# Patient Record
Sex: Male | Born: 1975 | Race: White | Hispanic: No | Marital: Married | Smoking: Never smoker
Health system: Southern US, Community
[De-identification: ages and names within clinical notes are randomized; demographics above are authoritative.]

## PROBLEM LIST (undated history)

## (undated) DIAGNOSIS — F909 Attention-deficit hyperactivity disorder, unspecified type: Secondary | ICD-10-CM

## (undated) HISTORY — DX: Attention-deficit hyperactivity disorder, unspecified type: F90.9

---

## 2000-03-07 ENCOUNTER — Emergency Department (HOSPITAL_COMMUNITY): Admission: EM | Admit: 2000-03-07 | Discharge: 2000-03-07 | Payer: Self-pay | Admitting: Emergency Medicine

## 2001-11-25 HISTORY — PX: MICRODISCECTOMY LUMBAR: SUR864

## 2001-11-26 ENCOUNTER — Encounter: Payer: Self-pay | Admitting: Family Medicine

## 2001-11-26 ENCOUNTER — Encounter: Admission: RE | Admit: 2001-11-26 | Discharge: 2001-11-26 | Payer: Self-pay | Admitting: Family Medicine

## 2001-12-15 ENCOUNTER — Observation Stay (HOSPITAL_COMMUNITY): Admission: RE | Admit: 2001-12-15 | Discharge: 2001-12-16 | Payer: Self-pay | Admitting: Neurological Surgery

## 2001-12-22 ENCOUNTER — Encounter: Payer: Self-pay | Admitting: Neurological Surgery

## 2001-12-22 ENCOUNTER — Ambulatory Visit (HOSPITAL_COMMUNITY): Admission: RE | Admit: 2001-12-22 | Discharge: 2001-12-22 | Payer: Self-pay | Admitting: Neurological Surgery

## 2001-12-23 ENCOUNTER — Encounter: Payer: Self-pay | Admitting: Neurological Surgery

## 2001-12-23 ENCOUNTER — Observation Stay (HOSPITAL_COMMUNITY): Admission: RE | Admit: 2001-12-23 | Discharge: 2001-12-24 | Payer: Self-pay | Admitting: Neurological Surgery

## 2002-03-01 ENCOUNTER — Encounter: Payer: Self-pay | Admitting: Neurological Surgery

## 2002-03-01 ENCOUNTER — Ambulatory Visit (HOSPITAL_COMMUNITY): Admission: RE | Admit: 2002-03-01 | Discharge: 2002-03-01 | Payer: Self-pay | Admitting: Neurological Surgery

## 2002-03-26 ENCOUNTER — Encounter: Payer: Self-pay | Admitting: Neurological Surgery

## 2002-03-26 ENCOUNTER — Encounter: Admission: RE | Admit: 2002-03-26 | Discharge: 2002-03-26 | Payer: Self-pay | Admitting: Neurological Surgery

## 2003-05-31 ENCOUNTER — Encounter: Payer: Self-pay | Admitting: Family Medicine

## 2003-05-31 ENCOUNTER — Encounter: Admission: RE | Admit: 2003-05-31 | Discharge: 2003-05-31 | Payer: Self-pay | Admitting: Family Medicine

## 2011-02-27 ENCOUNTER — Other Ambulatory Visit: Payer: Self-pay | Admitting: Neurological Surgery

## 2011-02-27 DIAGNOSIS — M545 Low back pain: Secondary | ICD-10-CM

## 2011-03-05 ENCOUNTER — Ambulatory Visit
Admission: RE | Admit: 2011-03-05 | Discharge: 2011-03-05 | Disposition: A | Discharge status: Home / Self Care | Payer: 59 | Source: Ambulatory Visit | Attending: Neurological Surgery | Admitting: Neurological Surgery

## 2011-03-05 DIAGNOSIS — M545 Low back pain: Secondary | ICD-10-CM

## 2016-04-25 HISTORY — PX: BICEPS TENDON REPAIR: SHX566

## 2016-05-23 DIAGNOSIS — S46212A Strain of muscle, fascia and tendon of other parts of biceps, left arm, initial encounter: Secondary | ICD-10-CM | POA: Diagnosis not present

## 2016-05-24 DIAGNOSIS — X58XXXA Exposure to other specified factors, initial encounter: Secondary | ICD-10-CM | POA: Diagnosis not present

## 2016-05-24 DIAGNOSIS — S46212A Strain of muscle, fascia and tendon of other parts of biceps, left arm, initial encounter: Secondary | ICD-10-CM | POA: Diagnosis not present

## 2016-05-24 DIAGNOSIS — G8918 Other acute postprocedural pain: Secondary | ICD-10-CM | POA: Diagnosis not present

## 2016-06-01 DIAGNOSIS — S46212D Strain of muscle, fascia and tendon of other parts of biceps, left arm, subsequent encounter: Secondary | ICD-10-CM | POA: Diagnosis not present

## 2016-06-01 DIAGNOSIS — M25522 Pain in left elbow: Secondary | ICD-10-CM | POA: Diagnosis not present

## 2016-06-20 DIAGNOSIS — Z9889 Other specified postprocedural states: Secondary | ICD-10-CM | POA: Diagnosis not present

## 2016-07-03 DIAGNOSIS — Z9889 Other specified postprocedural states: Secondary | ICD-10-CM | POA: Diagnosis not present

## 2016-11-29 DIAGNOSIS — R05 Cough: Secondary | ICD-10-CM | POA: Diagnosis not present

## 2016-11-29 DIAGNOSIS — Z6827 Body mass index (BMI) 27.0-27.9, adult: Secondary | ICD-10-CM | POA: Diagnosis not present

## 2016-11-29 DIAGNOSIS — Z77118 Contact with and (suspected) exposure to other environmental pollution: Secondary | ICD-10-CM | POA: Diagnosis not present

## 2017-01-21 DIAGNOSIS — K219 Gastro-esophageal reflux disease without esophagitis: Secondary | ICD-10-CM | POA: Diagnosis not present

## 2017-01-21 DIAGNOSIS — R5383 Other fatigue: Secondary | ICD-10-CM | POA: Diagnosis not present

## 2017-01-21 DIAGNOSIS — R0789 Other chest pain: Secondary | ICD-10-CM | POA: Diagnosis not present

## 2017-01-21 DIAGNOSIS — I10 Essential (primary) hypertension: Secondary | ICD-10-CM | POA: Diagnosis not present

## 2017-01-22 DIAGNOSIS — Z125 Encounter for screening for malignant neoplasm of prostate: Secondary | ICD-10-CM | POA: Diagnosis not present

## 2017-01-22 DIAGNOSIS — E784 Other hyperlipidemia: Secondary | ICD-10-CM | POA: Diagnosis not present

## 2017-01-22 DIAGNOSIS — I10 Essential (primary) hypertension: Secondary | ICD-10-CM | POA: Diagnosis not present

## 2017-01-22 DIAGNOSIS — Z0000 Encounter for general adult medical examination without abnormal findings: Secondary | ICD-10-CM | POA: Diagnosis not present

## 2017-02-06 NOTE — Progress Notes (Signed)
Cardiology Office Note   Date:  02/09/2017   ID:  Frank Walton, DOB [birth year], MRN [id number]  PCP:  Minda MeoARONSON,RICHARD A, MD  Cardiologist:   Rollene RotundaJames Keriann Rankin, MD  Referring:  Geoffry ParadiseAronson, Richard, MD  Chief Complaint  Patient presents with  . Chest Pain     History of Present Illness: Frank Walton is a 41 y.o. male who presents for evaluation of chest pain.  He reports his happen once severely a few weeks ago but it's actually been going on as a constant discomfort. He has a lot of emotional stress. 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 He is physically active on his job in caring back packs and the like. With this he denies any cardiovascular symptoms. He can't bring on the chest discomfort with this. It's a moderate aching at best.  There were not associated symptoms such as nausea vomiting or diaphoresis. He's not had any palpitations, presyncope or syncope. Denies any PND or orthopnea. He's had no weight gain or edema.     Past Medical History:  Diagnosis Date  . ADHD     Past Surgical History:  Procedure Laterality Date  . BICEPS TENDON REPAIR  04/2016  . MICRODISCECTOMY LUMBAR  2003     Current Outpatient Prescriptions  Medication Sig Dispense Refill  . amphetamine-dextroamphetamine (ADDERALL) 10 MG tablet Take 10 mg by mouth daily with breakfast.    . Multiple Vitamins-Minerals (EMERGEN-C IMMUNE PO) Take 1 tablet by mouth daily.    Marland Kitchen. PROAIR RESPICLICK 108 (90 Base) MCG/ACT AEPB Inhale 2 puffs into the lungs daily as needed.  6  . rosuvastatin (CRESTOR) 10 MG tablet Take 10 mg by mouth daily.  6   No current facility-administered medications for this visit.     Allergies:   Patient has no known allergies.    Social History:  The patient  reports that he has never smoked. He has quit using smokeless tobacco. His smokeless tobacco use included Chew.   Family History:  The patient's history  does not include any first degree relatives with heart disease at an early age.  His parents are alive and without significant comorbid disease.     ROS:  Please see the history of present illness.   Otherwise, review of systems are positive for none.   All other systems are reviewed and negative.    PHYSICAL EXAM: VS:  BP (!) 128/98 (BP Location: Right Arm, Cuff Size: Large)   Pulse 75   Ht 5\' 10"  (1.778 m)   Wt 197 lb 9.6 oz (89.6 kg)   BMI 28.35 kg/m  , BMI Body mass index is 28.35 kg/m. GENERAL:  Well appearing HEENT:  Pupils equal round and reactive, fundi not visualized, oral mucosa unremarkable NECK:  No jugular venous distention, waveform within normal limits, carotid upstroke brisk and symmetric, no bruits, no thyromegaly LYMPHATICS:  No cervical, inguinal adenopathy LUNGS:  Clear to auscultation bilaterally BACK:  No CVA tenderness CHEST:  Unremarkable HEART:  PMI not displaced or sustained,S1 and S2 within normal limits, no S3, no S4, no clicks, no rubs, no murmurs ABD:  Flat, positive bowel sounds normal in frequency in pitch, no bruits, no rebound, no guarding, no midline pulsatile mass, no hepatomegaly, no splenomegaly EXT:  2 plus pulses throughout, no edema, no cyanosis no clubbing SKIN:  No rashes no nodules NEURO:  Cranial nerves II through XII grossly  intact, motor grossly intact throughout PSYCH:  Cognitively intact, oriented to person place and time    EKG:  EKG is ordered today. The ekg ordered today demonstrates NSR, rate 75, axis WNL, no acute ST T wave changes.     Recent Labs: No results found for requested labs within last 8760 hours.    Lipid Panel No results found for: CHOL, TRIG, HDL, CHOLHDL, VLDL, LDLCALC, LDLDIRECT    Wt Readings from Last 3 Encounters:  02/07/17 197 lb 9.6 oz (89.6 kg)      Other studies Reviewed: Additional studies/ records that were reviewed today include: None. Review of the above records demonstrates:  Please see  elsewhere in the note.     ASSESSMENT AND PLAN:  CHEST PAIN:   I think that the pretest probability of obstructive CAD is low.  I will bring the patient back for a POET (Plain Old Exercise Test). This will allow me to screen for obstructive coronary disease, risk stratify and very importantly provide a prescription for exercise.  DYSLIPIDEMIA:  I would like to get the results of his recent lipid profile to review.  He and I talked about the correlation with heart disease, stress, lipids and other lifestyle issues.     Current medicines are reviewed at length with the patient today.  The patient does not have concerns regarding medicines.  The following changes have been made:  no change  Labs/ tests ordered today include:   Orders Placed This Encounter  Procedures  . EXERCISE TOLERANCE TEST  . EKG 12-Lead     Disposition:   FU with me as needed.     Signed, Rollene Rotunda, MD  02/09/2017 3:11 PM    [facility]

## 2017-02-07 ENCOUNTER — Encounter: Payer: Self-pay | Admitting: Cardiology

## 2017-02-07 ENCOUNTER — Ambulatory Visit (INDEPENDENT_AMBULATORY_CARE_PROVIDER_SITE_OTHER): Payer: BLUE CROSS/BLUE SHIELD | Admitting: Cardiology

## 2017-02-07 VITALS — BP 128/98 | HR 75 | Ht 70.0 in | Wt 197.6 lb

## 2017-02-07 DIAGNOSIS — R079 Chest pain, unspecified: Secondary | ICD-10-CM | POA: Diagnosis not present

## 2017-02-07 DIAGNOSIS — E785 Hyperlipidemia, unspecified: Secondary | ICD-10-CM

## 2017-02-07 NOTE — Patient Instructions (Signed)

## 2017-02-09 ENCOUNTER — Encounter: Payer: Self-pay | Admitting: Cardiology

## 2017-02-09 DIAGNOSIS — R079 Chest pain, unspecified: Secondary | ICD-10-CM | POA: Insufficient documentation

## 2017-02-09 DIAGNOSIS — E785 Hyperlipidemia, unspecified: Secondary | ICD-10-CM | POA: Insufficient documentation

## 2017-02-19 ENCOUNTER — Telehealth (HOSPITAL_COMMUNITY): Payer: Self-pay

## 2017-02-19 NOTE — Telephone Encounter (Signed)
Encounter complete. 

## 2017-02-21 ENCOUNTER — Ambulatory Visit (HOSPITAL_COMMUNITY)
Admission: RE | Admit: 2017-02-21 | Discharge: 2017-02-21 | Disposition: A | Discharge status: Home / Self Care | Payer: BLUE CROSS/BLUE SHIELD | Source: Ambulatory Visit | Attending: Internal Medicine | Admitting: Internal Medicine

## 2017-02-21 DIAGNOSIS — R079 Chest pain, unspecified: Secondary | ICD-10-CM

## 2017-02-21 DIAGNOSIS — E030 Congenital hypothyroidism with diffuse goiter: Secondary | ICD-10-CM | POA: Diagnosis not present

## 2017-02-21 LAB — EXERCISE TOLERANCE TEST
CHL RATE OF PERCEIVED EXERTION: 16
CSEPED: 12 min
CSEPEDS: 0 s
CSEPEW: 13.7 METS
MPHR: 178 {beats}/min
Peak HR: 173 {beats}/min
Percent HR: 97 %
Rest HR: 82 {beats}/min

## 2017-03-10 DIAGNOSIS — R0789 Other chest pain: Secondary | ICD-10-CM | POA: Diagnosis not present

## 2017-03-10 DIAGNOSIS — I10 Essential (primary) hypertension: Secondary | ICD-10-CM | POA: Diagnosis not present

## 2017-03-10 DIAGNOSIS — E784 Other hyperlipidemia: Secondary | ICD-10-CM | POA: Diagnosis not present

## 2017-03-10 DIAGNOSIS — F909 Attention-deficit hyperactivity disorder, unspecified type: Secondary | ICD-10-CM | POA: Diagnosis not present

## 2017-03-19 DIAGNOSIS — F909 Attention-deficit hyperactivity disorder, unspecified type: Secondary | ICD-10-CM | POA: Diagnosis not present

## 2017-07-07 DIAGNOSIS — F908 Attention-deficit hyperactivity disorder, other type: Secondary | ICD-10-CM | POA: Diagnosis not present

## 2017-12-21 DIAGNOSIS — M25511 Pain in right shoulder: Secondary | ICD-10-CM | POA: Diagnosis not present

## 2018-01-20 DIAGNOSIS — Z0000 Encounter for general adult medical examination without abnormal findings: Secondary | ICD-10-CM | POA: Diagnosis not present

## 2018-01-20 DIAGNOSIS — Z125 Encounter for screening for malignant neoplasm of prostate: Secondary | ICD-10-CM | POA: Diagnosis not present

## 2018-01-20 DIAGNOSIS — I10 Essential (primary) hypertension: Secondary | ICD-10-CM | POA: Diagnosis not present

## 2018-01-20 DIAGNOSIS — R82998 Other abnormal findings in urine: Secondary | ICD-10-CM | POA: Diagnosis not present

## 2018-01-26 DIAGNOSIS — E7849 Other hyperlipidemia: Secondary | ICD-10-CM | POA: Diagnosis not present

## 2018-01-26 DIAGNOSIS — E663 Overweight: Secondary | ICD-10-CM | POA: Diagnosis not present

## 2018-01-26 DIAGNOSIS — Z0000 Encounter for general adult medical examination without abnormal findings: Secondary | ICD-10-CM | POA: Diagnosis not present

## 2018-01-26 DIAGNOSIS — Z1389 Encounter for screening for other disorder: Secondary | ICD-10-CM | POA: Diagnosis not present

## 2018-01-26 DIAGNOSIS — Z77118 Contact with and (suspected) exposure to other environmental pollution: Secondary | ICD-10-CM | POA: Diagnosis not present

## 2018-01-26 DIAGNOSIS — R05 Cough: Secondary | ICD-10-CM | POA: Diagnosis not present

## 2018-01-26 DIAGNOSIS — I10 Essential (primary) hypertension: Secondary | ICD-10-CM | POA: Diagnosis not present

## 2018-01-26 DIAGNOSIS — Z6827 Body mass index (BMI) 27.0-27.9, adult: Secondary | ICD-10-CM | POA: Diagnosis not present

## 2018-07-29 DIAGNOSIS — E7849 Other hyperlipidemia: Secondary | ICD-10-CM | POA: Diagnosis not present

## 2018-07-29 DIAGNOSIS — I10 Essential (primary) hypertension: Secondary | ICD-10-CM | POA: Diagnosis not present

## 2018-07-29 DIAGNOSIS — K219 Gastro-esophageal reflux disease without esophagitis: Secondary | ICD-10-CM | POA: Diagnosis not present

## 2018-07-29 DIAGNOSIS — M1990 Unspecified osteoarthritis, unspecified site: Secondary | ICD-10-CM | POA: Diagnosis not present

## 2019-02-02 DIAGNOSIS — Z0000 Encounter for general adult medical examination without abnormal findings: Secondary | ICD-10-CM | POA: Diagnosis not present

## 2019-02-02 DIAGNOSIS — I10 Essential (primary) hypertension: Secondary | ICD-10-CM | POA: Diagnosis not present

## 2019-02-02 DIAGNOSIS — E7849 Other hyperlipidemia: Secondary | ICD-10-CM | POA: Diagnosis not present

## 2019-02-02 DIAGNOSIS — Z125 Encounter for screening for malignant neoplasm of prostate: Secondary | ICD-10-CM | POA: Diagnosis not present

## 2019-02-08 DIAGNOSIS — M1990 Unspecified osteoarthritis, unspecified site: Secondary | ICD-10-CM | POA: Diagnosis not present

## 2019-02-08 DIAGNOSIS — Z0000 Encounter for general adult medical examination without abnormal findings: Secondary | ICD-10-CM | POA: Diagnosis not present

## 2019-02-08 DIAGNOSIS — I10 Essential (primary) hypertension: Secondary | ICD-10-CM | POA: Diagnosis not present

## 2019-02-08 DIAGNOSIS — E7849 Other hyperlipidemia: Secondary | ICD-10-CM | POA: Diagnosis not present

## 2019-02-08 DIAGNOSIS — Z1331 Encounter for screening for depression: Secondary | ICD-10-CM | POA: Diagnosis not present

## 2019-02-08 DIAGNOSIS — K219 Gastro-esophageal reflux disease without esophagitis: Secondary | ICD-10-CM | POA: Diagnosis not present

## 2019-12-04 DIAGNOSIS — Z20828 Contact with and (suspected) exposure to other viral communicable diseases: Secondary | ICD-10-CM | POA: Diagnosis not present

## 2019-12-23 DIAGNOSIS — M25551 Pain in right hip: Secondary | ICD-10-CM | POA: Diagnosis not present

## 2019-12-23 DIAGNOSIS — M25552 Pain in left hip: Secondary | ICD-10-CM | POA: Diagnosis not present

## 2020-02-07 DIAGNOSIS — E7849 Other hyperlipidemia: Secondary | ICD-10-CM | POA: Diagnosis not present

## 2020-02-07 DIAGNOSIS — Z125 Encounter for screening for malignant neoplasm of prostate: Secondary | ICD-10-CM | POA: Diagnosis not present

## 2020-02-07 DIAGNOSIS — Z0000 Encounter for general adult medical examination without abnormal findings: Secondary | ICD-10-CM | POA: Diagnosis not present

## 2020-02-14 DIAGNOSIS — R6882 Decreased libido: Secondary | ICD-10-CM | POA: Diagnosis not present

## 2020-02-14 DIAGNOSIS — E785 Hyperlipidemia, unspecified: Secondary | ICD-10-CM | POA: Diagnosis not present

## 2020-02-14 DIAGNOSIS — Z0000 Encounter for general adult medical examination without abnormal findings: Secondary | ICD-10-CM | POA: Diagnosis not present

## 2020-02-14 DIAGNOSIS — I10 Essential (primary) hypertension: Secondary | ICD-10-CM | POA: Diagnosis not present

## 2020-02-14 DIAGNOSIS — F909 Attention-deficit hyperactivity disorder, unspecified type: Secondary | ICD-10-CM | POA: Diagnosis not present

## 2020-06-02 DIAGNOSIS — S46211A Strain of muscle, fascia and tendon of other parts of biceps, right arm, initial encounter: Secondary | ICD-10-CM | POA: Diagnosis not present

## 2020-06-05 DIAGNOSIS — S46211A Strain of muscle, fascia and tendon of other parts of biceps, right arm, initial encounter: Secondary | ICD-10-CM | POA: Diagnosis not present

## 2020-06-07 ENCOUNTER — Other Ambulatory Visit: Payer: Self-pay | Admitting: Orthopedic Surgery

## 2020-06-07 DIAGNOSIS — M25521 Pain in right elbow: Secondary | ICD-10-CM

## 2020-08-16 DIAGNOSIS — I10 Essential (primary) hypertension: Secondary | ICD-10-CM | POA: Diagnosis not present

## 2020-11-06 DIAGNOSIS — R059 Cough, unspecified: Secondary | ICD-10-CM | POA: Diagnosis not present

## 2020-11-06 DIAGNOSIS — R509 Fever, unspecified: Secondary | ICD-10-CM | POA: Diagnosis not present

## 2020-11-06 DIAGNOSIS — G4730 Sleep apnea, unspecified: Secondary | ICD-10-CM | POA: Diagnosis not present

## 2020-12-06 ENCOUNTER — Encounter: Payer: Self-pay | Admitting: Pulmonary Disease

## 2020-12-06 ENCOUNTER — Other Ambulatory Visit: Payer: Self-pay

## 2020-12-06 ENCOUNTER — Ambulatory Visit: Payer: BC Managed Care – PPO | Admitting: Pulmonary Disease

## 2020-12-06 DIAGNOSIS — R053 Chronic cough: Secondary | ICD-10-CM | POA: Insufficient documentation

## 2020-12-06 MED ORDER — FLOVENT HFA 110 MCG/ACT IN AERO: 1.0000 | INHALATION_SPRAY | Freq: Every day | RESPIRATORY_TRACT | 12 refills | Status: DC

## 2020-12-06 MED ORDER — OMEPRAZOLE 40 MG PO CPDR: 40.0000 mg | DELAYED_RELEASE_CAPSULE | Freq: Every day | ORAL | 1 refills | Status: AC

## 2020-12-06 NOTE — Assessment & Plan Note (Addendum)
His seasonal cough may be related to environmental exposure to diesel fumes.  Current cough might be a post bronchitis cough, unclear if this is being perpetuated by postnasal drip and reflux .  We will try to target all 3  For postnasal drip, take Chlor-Trimeton 4 mg at bedtime for 2 weeks Daytime, take store brand Sudafed/ walphed /phenylephrine 10 mg daily for 2 weeks  Prescription for omeprazole 40 mg daily for 6 weeks for reflux. Prescription for Flovent 110 -1 puff daily, rinse mouth after use  If cough persist beyond 12 weeks then proceed with ENT evaluation versus HRCT

## 2020-12-06 NOTE — Patient Instructions (Signed)
  Your cough might be a post bronchitis cough, unclear if this is being perpetuated by postnasal drip and reflux  For postnasal drip, take Chlor-Trimeton 4 mg at bedtime for 2 weeks Daytime, take store brand Sudafed/ walphed /phenylephrine 10 mg daily for 2 weeks  Prescription for omeprazole 40 mg daily for 6 weeks for reflux. Prescription for Flovent 110 -1 puff daily, rinse mouth after use  Call me in 2 to 4 weeks to report

## 2020-12-06 NOTE — Progress Notes (Signed)
Subjective:    Patient ID: Frank Walton, male    DOB: [birth year], 45 y.o.   MRN: [id number]  HPI  Chief Complaint  Patient presents with  . Consult    Cough that is dry and at times makes him vomit.  Going on about 9 weeks now.  Feels that this is a little better.    45 year old never smoker presents for evaluation of chronic cough for the past 9 weeks. His wife Frank Walton is a perfusionist at [employer]. He reports a seasonal cough every winter that starts around January and improves by spring, ongoing for the last 6 years.  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  His current cough has been lasting about 9 weeks, he reports URI symptoms at onset followed by cough.  He tested negative for COVID several times and the flu.  He states that this cough is "different" from his usual cough both in tone and intensity and accompanied by hoarseness of his voice. He was treated by his PCP with prednisone and promethazine/dextromethorphan cough syrup.  He took a course of Zyrtec and also tried [employer] without any improvement.  He tested negative for COVID multiple times. Cough is of severe intensity, worse after lunch and in the evenings, does not wake him up from sleep, worsened by talking eating or drinking.  He denies childhood history of asthma but reports that even as a child he was sensitive to fumes and would develop a cough when he would drive into [place] due to the smog He reports occasional GERD symptoms that are worsened by beer and chocolate.  Reports some nasal congestion, denies seasonal allergies.  He takes Pepcid over-the-counter intermittently for reflux symptoms  Chest x-ray was reported normal at PCP office   Past Medical History:  Diagnosis Date  . ADHD    Past Surgical History:  Procedure Laterality Date  . BICEPS TENDON REPAIR  04/2016  . MICRODISCECTOMY LUMBAR   2003    No Known Allergies  Social History   Socioeconomic History  . Marital status: Married    Spouse name: Not on file  . Number of children: Not on file  . Years of education: Not on file  . Highest education level: Not on file  Occupational History  . Not on file  Tobacco Use  . Smoking status: Never Smoker  . Smokeless tobacco: Former Neurosurgeon    Types: Chew  Substance and Sexual Activity  . Alcohol use: Not on file  . Drug use: Not on file  . Sexual activity: Not on file  Other Topics Concern  . Not on file  Social History Narrative  . Not on file   Social Determinants of Health   Financial Resource Strain: Not on file  Food Insecurity: Not on file  Transportation Needs: Not on file  Physical Activity: Not on file  Stress: Not on file  Social Connections: Not on file  Intimate Partner Violence: Not on file    History reviewed. No pertinent family history.    Review of Systems Constitutional: negative for anorexia, fevers and sweats  Eyes: negative for irritation, redness and visual disturbance  Ears, nose, mouth, throat, and face: negative for earaches, epistaxis, nasal congestion and sore throat  Respiratory: negative for cough, dyspnea on exertion, sputum and wheezing  Cardiovascular: negative for chest pain, dyspnea, lower extremity edema, orthopnea,  palpitations and syncope  Gastrointestinal: negative for abdominal pain, constipation, diarrhea, melena, nausea and vomiting  Genitourinary:negative for dysuria, frequency and hematuria  Hematologic/lymphatic: negative for bleeding, easy bruising and lymphadenopathy  Musculoskeletal:negative for arthralgias, muscle weakness and stiff joints  Neurological: negative for coordination problems, gait problems, headaches and weakness  Endocrine: negative for diabetic symptoms including polydipsia, polyuria and weight loss     Objective:   Physical Exam  Gen. Pleasant, well-nourished, in no distress, normal  affect ENT - no pallor,icterus, no post nasal drip Neck: No JVD, no thyromegaly, no carotid bruits Lungs: no use of accessory muscles, no dullness to percussion, clear without rales or rhonchi  Cardiovascular: Rhythm regular, heart sounds  normal, no murmurs or gallops, no peripheral edema Abdomen: soft and non-tender, no hepatosplenomegaly, BS normal. Musculoskeletal: No deformities, no cyanosis or clubbing Neuro:  alert, non focal        Assessment & Plan:

## 2020-12-15 ENCOUNTER — Institutional Professional Consult (permissible substitution): Payer: BC Managed Care – PPO | Admitting: Pulmonary Disease

## 2021-01-05 ENCOUNTER — Encounter: Payer: Self-pay | Admitting: Primary Care

## 2021-01-05 ENCOUNTER — Other Ambulatory Visit: Payer: Self-pay

## 2021-01-05 ENCOUNTER — Ambulatory Visit: Payer: BC Managed Care – PPO | Admitting: Primary Care

## 2021-01-05 DIAGNOSIS — R053 Chronic cough: Secondary | ICD-10-CM | POA: Diagnosis not present

## 2021-01-05 NOTE — Progress Notes (Signed)
@Frank Walton  ID: , male    DOB: [birth year], 45 y.o.   MRN: [id number]  Chief Complaint  Frank Walton presents with  . Follow-up    Referring provider: [id number], MD  HPI: 45 year old male, never smoked.  Past medical history significant for dyslipidemia and chronic cough.  Frank Walton of Dr. 49, seen for initial consult on 12/06/2020. 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 Cough could also be post viral or triggered by PND/GERD.   For post nasal drip he was advised to take Sudafed 10mg  daily for 2 weeks and Chlor-trimeton 4mg  at bedtime x 2 weeks. For GERD he was advised to take omeprazole 40mg  daily x 6 weeks. For cough he was given prescription for Flovent [employer] one puff daily. If cough persists beyond 12 weeks then need ENT evaluation vs HRCT.    01/05/2021 Frank Walton presents today for 1 month follow-up chronic cough. He has a seasonal cough which starts around January and improves by spring.  This is been ongoing for the last 6 years. He has had his current cough for the last 9-12 weeks.  He was treated with course of prednisone and promethazine/dextromethorphan cough syrup.  Cough is worse after eating and drinking.  He has occasional GERD symptoms and nasal congestion.  He takes over-the-counter Pepcid as needed.  He has tested negative for Covid several times.   He is doing well today, no acute complaints. His cough is 90% better. He has been taking medication as prescribed religously. He has two weeks left of omeprazole course. Continues using flovent inhaler one puff daily.    No Known Allergies   There is no immunization history on file for this Frank Walton.  Past Medical History:  Diagnosis Date  . ADHD     Tobacco History: Social History   Tobacco Use  Smoking Status Never Smoker  Smokeless Tobacco Former  . Types: Chew   Counseling given: Not Answered   Outpatient Medications Prior to Visit   Medication Sig Dispense Refill  . amphetamine-dextroamphetamine (ADDERALL) 10 MG tablet Take 10 mg by mouth daily with breakfast.    . fluticasone (FLOVENT HFA) 110 MCG/ACT inhaler Inhale 1 puff into the lungs daily. RINSE AFTER EACH USE 1 each 12  . losartan (COZAAR) 50 MG tablet losartan 50 mg tablet  TAKE 1 TABLET BY MOUTH EVERY DAY    . Multiple Vitamins-Minerals (EMERGEN-C IMMUNE PO) Take 1 tablet by mouth daily.    03/05/2021 omeprazole (PRILOSEC) 40 MG capsule Take 1 capsule (40 mg total) by mouth daily. 30 capsule 1  . rosuvastatin (CRESTOR) 10 MG tablet Take 10 mg by mouth daily.  6  . PROAIR RESPICLICK 108 (90 Base) MCG/ACT AEPB Inhale 2 puffs into the lungs daily as needed. (Frank Walton not taking: Reported on 01/05/2021)  6  . promethazine-dextromethorphan (PROMETHAZINE-DM) 6.25-15 MG/5ML syrup  (Frank Walton not taking: Reported on 01/05/2021)     No facility-administered medications prior to visit.    Review of Systems  Review of Systems  Constitutional: Negative.   Respiratory: Negative for cough, shortness of breath and wheezing.    Physical Exam  BP 120/80 (BP Location: Right Arm, Frank Walton Position: Sitting, Cuff Size: Normal)   Pulse 86   Temp 98.1 F (36.7 C) (Temporal)   Ht 5\' 11"  (1.803 m)   Wt 194 lb 6.4 oz (88.2 kg)   SpO2 98%   BMI 27.11 kg/m  Physical Exam Constitutional:  Appearance: Normal appearance.  HENT:     Mouth/Throat:     Comments: Deferred d/t masking Cardiovascular:     Rate and Rhythm: Normal rate and regular rhythm.  Pulmonary:     Effort: Pulmonary effort is normal.     Breath sounds: Normal breath sounds.  Musculoskeletal:        General: Normal range of motion.  Skin:    General: Skin is warm and dry.  Neurological:     General: No focal deficit present.     Mental Status: He is alert and oriented to person, place, and time. Mental status is at baseline.  Psychiatric:        Mood and Affect: Mood normal.        Behavior: Behavior normal.         Thought Content: Thought content normal.        Judgment: Judgment normal.      Lab Results:  CBC No results found for: WBC, RBC, HGB, HCT, PLT, MCV, MCH, MCHC, RDW, LYMPHSABS, MONOABS, EOSABS, BASOSABS  BMET No results found for: NA, K, CL, CO2, GLUCOSE, BUN, CREATININE, CALCIUM, GFRNONAA, GFRAA  BNP No results found for: BNP  ProBNP No results found for: PROBNP  Imaging: No results found.   Assessment & Plan:   Chronic cough - Cough has significantly improved while on PPI and ICS therapy. He stopped Sudafed and Chlor-trimeton after two weeks and cough did not return. Recommend continuing Omeprazole 40mg  for additional 2 weeks and continue Flovent one puff daily during winter months. If cough returns advised he resume first medication he stopped before cough came back. FU in 6-9 months (fall 2022)   2023, NP 01/05/2021

## 2021-01-05 NOTE — Assessment & Plan Note (Addendum)
-   Cough has significantly improved while on PPI and ICS therapy. He stopped Sudafed and Chlor-trimeton after two weeks and cough did not return. Recommend continuing Omeprazole 40mg  for additional 2 weeks and continue Flovent one puff daily during winter months. If cough returns advised he resume first medication he stopped before cough came back. FU in 8-9 months (fall 2022)

## 2021-01-05 NOTE — Patient Instructions (Addendum)
  Recommendations: - Stop Sudafed and Chlor-trimeton  - Continue Prilosec 40mg  daily x 2 week then stop (if cough returns, please resume) - Use Flovent inhaler 1 puff daily until spring time   Follow-up: - 8 months with Dr. 06-16-1991

## 2021-02-14 DIAGNOSIS — Z125 Encounter for screening for malignant neoplasm of prostate: Secondary | ICD-10-CM | POA: Diagnosis not present

## 2021-02-14 DIAGNOSIS — E785 Hyperlipidemia, unspecified: Secondary | ICD-10-CM | POA: Diagnosis not present

## 2021-02-21 DIAGNOSIS — R82998 Other abnormal findings in urine: Secondary | ICD-10-CM | POA: Diagnosis not present

## 2021-02-21 DIAGNOSIS — Z0000 Encounter for general adult medical examination without abnormal findings: Secondary | ICD-10-CM | POA: Diagnosis not present

## 2021-02-21 DIAGNOSIS — I10 Essential (primary) hypertension: Secondary | ICD-10-CM | POA: Diagnosis not present

## 2021-08-14 DIAGNOSIS — L281 Prurigo nodularis: Secondary | ICD-10-CM | POA: Diagnosis not present

## 2021-08-14 DIAGNOSIS — D225 Melanocytic nevi of trunk: Secondary | ICD-10-CM | POA: Diagnosis not present

## 2021-08-14 DIAGNOSIS — D2262 Melanocytic nevi of left upper limb, including shoulder: Secondary | ICD-10-CM | POA: Diagnosis not present

## 2021-08-14 DIAGNOSIS — D2261 Melanocytic nevi of right upper limb, including shoulder: Secondary | ICD-10-CM | POA: Diagnosis not present

## 2021-09-18 ENCOUNTER — Ambulatory Visit: Payer: BC Managed Care – PPO | Admitting: Pulmonary Disease

## 2022-02-21 DIAGNOSIS — Z125 Encounter for screening for malignant neoplasm of prostate: Secondary | ICD-10-CM | POA: Diagnosis not present

## 2022-02-21 DIAGNOSIS — E785 Hyperlipidemia, unspecified: Secondary | ICD-10-CM | POA: Diagnosis not present

## 2022-02-25 DIAGNOSIS — I10 Essential (primary) hypertension: Secondary | ICD-10-CM | POA: Diagnosis not present

## 2022-02-25 DIAGNOSIS — Z1331 Encounter for screening for depression: Secondary | ICD-10-CM | POA: Diagnosis not present

## 2022-02-25 DIAGNOSIS — Z0000 Encounter for general adult medical examination without abnormal findings: Secondary | ICD-10-CM | POA: Diagnosis not present

## 2022-04-19 ENCOUNTER — Other Ambulatory Visit: Payer: Self-pay

## 2022-04-19 ENCOUNTER — Emergency Department (HOSPITAL_COMMUNITY)
Admission: EM | Admit: 2022-04-19 | Discharge: 2022-04-19 | Disposition: A | Discharge status: Home / Self Care | Payer: BC Managed Care – PPO | Attending: Emergency Medicine | Admitting: Emergency Medicine

## 2022-04-19 ENCOUNTER — Emergency Department (HOSPITAL_COMMUNITY): Payer: BC Managed Care – PPO

## 2022-04-19 DIAGNOSIS — R7309 Other abnormal glucose: Secondary | ICD-10-CM | POA: Diagnosis not present

## 2022-04-19 DIAGNOSIS — Q630 Accessory kidney: Secondary | ICD-10-CM | POA: Diagnosis not present

## 2022-04-19 DIAGNOSIS — Z79899 Other long term (current) drug therapy: Secondary | ICD-10-CM | POA: Insufficient documentation

## 2022-04-19 DIAGNOSIS — I2510 Atherosclerotic heart disease of native coronary artery without angina pectoris: Secondary | ICD-10-CM | POA: Diagnosis not present

## 2022-04-19 DIAGNOSIS — R0789 Other chest pain: Secondary | ICD-10-CM | POA: Diagnosis not present

## 2022-04-19 DIAGNOSIS — R079 Chest pain, unspecified: Secondary | ICD-10-CM

## 2022-04-19 DIAGNOSIS — I10 Essential (primary) hypertension: Secondary | ICD-10-CM | POA: Insufficient documentation

## 2022-04-19 LAB — CBC WITH DIFFERENTIAL/PLATELET
Abs Immature Granulocytes: 0.02 10*3/uL (ref 0.00–0.07)
Basophils Absolute: 0.1 10*3/uL (ref 0.0–0.1)
Basophils Relative: 1 %
Eosinophils Absolute: 0.2 10*3/uL (ref 0.0–0.5)
Eosinophils Relative: 3 %
HCT: 49.6 % (ref 39.0–52.0)
Hemoglobin: 16.5 g/dL (ref 13.0–17.0)
Immature Granulocytes: 0 %
Lymphocytes Relative: 26 %
Lymphs Abs: 1.9 10*3/uL (ref 0.7–4.0)
MCH: 30.4 pg (ref 26.0–34.0)
MCHC: 33.3 g/dL (ref 30.0–36.0)
MCV: 91.3 fL (ref 80.0–100.0)
Monocytes Absolute: 0.8 10*3/uL (ref 0.1–1.0)
Monocytes Relative: 11 %
Neutro Abs: 4.3 10*3/uL (ref 1.7–7.7)
Neutrophils Relative %: 59 %
Platelets: 257 10*3/uL (ref 150–400)
RBC: 5.43 MIL/uL (ref 4.22–5.81)
RDW: 13.5 % (ref 11.5–15.5)
WBC: 7.2 10*3/uL (ref 4.0–10.5)
nRBC: 0 % (ref 0.0–0.2)

## 2022-04-19 LAB — BASIC METABOLIC PANEL
Anion gap: 8 (ref 5–15)
BUN: 12 mg/dL (ref 6–20)
CO2: 25 mmol/L (ref 22–32)
Calcium: 9.3 mg/dL (ref 8.9–10.3)
Chloride: 105 mmol/L (ref 98–111)
Creatinine, Ser: 0.98 mg/dL (ref 0.61–1.24)
GFR, Estimated: >60 mL/min (ref >60)
Glucose, Bld: 111 mg/dL — ABNORMAL HIGH (ref 70–99)
Potassium: 4.3 mmol/L (ref 3.5–5.1)
Sodium: 138 mmol/L (ref 135–145)

## 2022-04-19 LAB — TROPONIN I (HIGH SENSITIVITY)
Troponin I (High Sensitivity): 4 ng/L (ref <18)
Troponin I (High Sensitivity): 3 ng/L (ref <18)

## 2022-04-19 MED ORDER — IOHEXOL 350 MG/ML SOLN
100.0000 mL | Freq: Once | INTRAVENOUS | Status: AC | PRN
  Administered 2022-04-19: 100 mL via INTRAVENOUS

## 2022-04-19 MED ORDER — ASPIRIN 81 MG PO CHEW
324.0000 mg | CHEWABLE_TABLET | Freq: Once | ORAL | Status: AC
  Administered 2022-04-19: 324 mg via ORAL
  Filled 2022-04-19: qty 4

## 2022-04-19 NOTE — ED Provider Notes (Signed)
[facility] EMERGENCY DEPARTMENT Provider Note   CSN: [id number] Arrival date & time: 04/19/22  [id number]     History  Chief complaint: Chest pain  Frank Walton is a 46 y.o. male.  The history is provided by the patient.  He has history of hypertension, hyperlipidemia and comes in because of chest pain.  He has been having pain in the right side of his chest for the last 4 days and he thought it was related to some exertion that he had done.  Over the last 24 hours, he has developed pain in the left side of the chest with some radiation through to the back into the left arm.  This pain is described as a heavy, tight feeling.  It is worse when he takes a deep breath and worse when he lays on his back.  There is no associated dyspnea, nausea, diaphoresis.  He has not had pain like this before.  He did take a dose of ibuprofen 800 mg without any relief.  He is a non-smoker and without a history of diabetes.  There is no known family of premature coronary atherosclerosis.   Home Medications Prior to Admission medications   Medication Sig Start Date End Date Taking? Authorizing Provider  amphetamine-dextroamphetamine (ADDERALL) 10 MG tablet Take 10 mg by mouth daily with breakfast.    [provider]  fluticasone (FLOVENT HFA) 110 MCG/ACT inhaler Inhale 1 puff into the lungs daily. RINSE AFTER EACH USE 12/06/20   Rigoberto Noel, MD  losartan (COZAAR) 50 MG tablet losartan 50 mg tablet  TAKE 1 TABLET BY MOUTH EVERY DAY    [provider]  Multiple Vitamins-Minerals (EMERGEN-C IMMUNE PO) Take 1 tablet by mouth daily.    [provider]  omeprazole (PRILOSEC) 40 MG capsule Take 1 capsule (40 mg total) by mouth daily. 12/06/20   Rigoberto Noel, MD  rosuvastatin (CRESTOR) 10 MG tablet Take 10 mg by mouth daily. 01/23/17   [provider]      Allergies    Patient has no known allergies.    Review of Systems   Review of Systems  All other systems  reviewed and are negative.  Physical Exam Updated Vital Signs BP (!) 135/105 (BP Location: Left Arm)   Pulse 76   Temp 97.7 F (36.5 C) (Oral)   Resp 16   SpO2 100%  Physical Exam Vitals and nursing note reviewed.  46 year old male, resting comfortably and in no acute distress. Vital signs are significant for elevated blood pressure. Oxygen saturation is 100%, which is normal. Head is normocephalic and atraumatic. PERRLA, EOMI. Oropharynx is clear. Neck is nontender and supple without adenopathy or JVD. Back is nontender and there is no CVA tenderness. Lungs are clear without rales, wheezes, or rhonchi. Chest is nontender. Heart has regular rate and rhythm without murmur. Abdomen is soft, flat, nontender. Extremities have no cyanosis or edema, full range of motion is present. Skin is warm and dry without rash. Neurologic: Mental status is normal, cranial nerves are intact, moves all extremities equally.  ED Results / Procedures / Treatments   Labs (all labs ordered are listed, but only abnormal results are displayed) Labs Reviewed  BASIC METABOLIC PANEL - Abnormal; Notable for the following components:      Result Value   Glucose, Bld 111 (*)    All other components within normal limits  CBC WITH DIFFERENTIAL/PLATELET  TROPONIN I (HIGH SENSITIVITY)    EKG EKG  Interpretation  Date/Time:  Friday Apr 19 2022 05:47:07 EDT Ventricular Rate:  76 PR Interval:  159 QRS Duration: 87 QT Interval:  379 QTC Calculation: 427 R Axis:   73 Text Interpretation: Sinus rhythm Normal ECG No old tracing to compare Confirmed by Delora Fuel ([id number]) on 04/19/2022 5:54:41 AM  Radiology No results found.  Procedures Procedures  Cardiac monitor shows normal sinus rhythm, per my interpretation.  Medications Ordered in ED Medications  aspirin chewable tablet 324 mg (has no administration in time range)    ED Course/ Medical Decision Making/ A&P                           Medical  Decision Making Amount and/or Complexity of Data Reviewed Labs: ordered. Radiology: ordered.  Risk OTC drugs.   Chest pain of uncertain cause.  Certainly there is concern for ACS.  With pain being pleuritic, consider pulmonary embolism, but he does not have any risk factors for pulmonary embolism.  Consider aortic dissection.  Pain could also be musculoskeletal.  ECG is obtained and is independently viewed and interpreted by me and is completely normal with no prior ECGs available for comparison.  Blood pressure was checked in both arms and there is not a significant difference.  He will be given aspirin to treat possible ACS, will check screening labs including troponin x2.  We will also send for CT angiogram dissection study.  Old records are reviewed, and he has no relevant past visits.  He did have an stress test in 2018 which was normal.  I have reviewed and interpreted the initial lab reports.  Troponin is normal, CBC normal, metabolic panel significant only for mildly elevated glucose.  Repeat troponin and CT scan are pending.  Heart score is 2, which puts him at low risk for major adverse cardiac events in the next 6 weeks.  Case is signed out to Dr. Tyrone Nine.  Final Clinical Impression(s) / ED Diagnoses Final diagnoses:  Nonspecific chest pain    Rx / DC Orders ED Discharge Orders     None         Delora Fuel, MD [id number] [phone]

## 2022-04-19 NOTE — ED Triage Notes (Signed)
Pt c/o L sided chest pain since Tuesday, thought it was from throwing daughter in pool. Subsided Wednesday and came back tonight around midnight, feeling like pressure radiating to L arm, hurts to take a deep breath. Hx high altitude pulmonary hypertension, HTN.

## 2022-04-19 NOTE — ED Provider Notes (Signed)
46 yo with a CC of chest pain. Atypical in nature, thought to be musculoskeletal.  Plan to obtain a dissection study and await a second troponin.  Dissection study resulted and is negative.  Second troponin negative.  Will discharge home.  Treat as musculoskeletal for plan.  PCP follow-up.   Melene Plan, DO 04/19/22 0900

## 2022-04-19 NOTE — Discharge Instructions (Signed)
Take 4 over the counter ibuprofen tablets 3 times a day or 2 over-the-counter naproxen tablets twice a day for pain. Also take tylenol 1000mg(2 extra strength) four times a day.    

## 2022-04-29 DIAGNOSIS — R079 Chest pain, unspecified: Secondary | ICD-10-CM | POA: Diagnosis not present

## 2022-05-19 DIAGNOSIS — I2510 Atherosclerotic heart disease of native coronary artery without angina pectoris: Secondary | ICD-10-CM | POA: Insufficient documentation

## 2022-05-19 NOTE — Progress Notes (Signed)
Cardiology Office Note   Date:  05/21/2022   ID:  Frank Walton, DOB 1976/08/10, MRN [id number]  PCP:  Geoffry Paradise, MD  Cardiologist:   Rollene Rotunda, MD Referring:  ED    Chief Complaint  Patient presents with   Chest Pain      History of Present Illness: Frank Walton is a 46 y.o. male who presents for evaluation of chest discomfort.  He was in the ED in late May with chest pain.  I reviewed these records for this visit.   He had a CT negative for PE but there was coronary atherosclerosis noted.  He said that this discomfort happened at rest.  It was severe.  It was 10 out of 10.  It was sharp.  It was left upper chest and went around his upper shoulder to his back.  It was worse lying flat.  He could not find a comfortable position.  He has not had pain like this before.  It was severe enough to take his breath away but it was not particularly short of breath.  He was not describing nausea vomiting or diaphoresis.  He did not have palpitations, presyncope or syncope.  He is otherwise been active.  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  His wife is a perfusionist.  I saw him in 2018 for evaluation of chest pain.  He had a negative POET ([employer] Exercise Treadmill).     Past Medical History:  Diagnosis Date   ADHD     Past Surgical History:  Procedure Laterality Date   BICEPS TENDON REPAIR  04/2016   MICRODISCECTOMY LUMBAR  2003     Current Outpatient Medications  Medication Sig Dispense Refill   amphetamine-dextroamphetamine (ADDERALL XR) 20 MG 24 hr capsule Take 20 mg by mouth See admin instructions. Monday-friday     famotidine-calcium carbonate-magnesium hydroxide (PEPCID COMPLETE) 10-800-165 MG chewable tablet Chew 1 tablet by mouth daily as needed (heartburn).     ibuprofen (ADVIL) 200 MG tablet Take 800 mg by mouth every 6 (six) hours as needed for headache or moderate pain.     losartan (COZAAR) 50 MG tablet Take 50 mg by mouth daily.      omeprazole (PRILOSEC) 40 MG capsule Take 1 capsule (40 mg total) by mouth daily. 30 capsule 1   rosuvastatin (CRESTOR) 10 MG tablet Take 10 mg by mouth daily.  6   No current facility-administered medications for this visit.    Allergies:   Patient has no known allergies.    Social History:  The patient  reports that he has never smoked. He has quit using smokeless tobacco.  His smokeless tobacco use included chew.   Family History:  The patient's family history is not on file.    ROS:  Please see the history of present illness.   Otherwise, review of systems are positive for none.   All other systems are reviewed and negative.    PHYSICAL EXAM: VS:  BP (!) 145/92   Pulse 95   Ht 5\' 11"  (1.803 m)   Wt 196 lb 12.8 oz (89.3 kg)   SpO2 97%   BMI 27.45 kg/m  , BMI Body mass index is 27.45 kg/m. GENERAL:  Well appearing HEENT:  Pupils equal round and reactive, fundi not visualized, oral mucosa unremarkable NECK:  No jugular venous distention, waveform within normal limits, carotid upstroke brisk and symmetric, no bruits, no thyromegaly LYMPHATICS:  No cervical, inguinal adenopathy  LUNGS:  Clear to auscultation bilaterally BACK:  No CVA tenderness CHEST:  Unremarkable HEART:  PMI not displaced or sustained,S1 and S2 within normal limits, no S3, no S4, no clicks, no rubs, no murmurs ABD:  Flat, positive bowel sounds normal in frequency in pitch, no bruits, no rebound, no guarding, no midline pulsatile mass, no hepatomegaly, no splenomegaly EXT:  2 plus pulses throughout, no edema, no cyanosis no clubbing SKIN:  No rashes no nodules NEURO:  Cranial nerves II through XII grossly intact, motor grossly intact throughout PSYCH:  Cognitively intact, oriented to person place and time    EKG:  EKG is not ordered today. The ekg ordered 04/19/2022 demonstrates sinus rhythm, rate 76, axis within normal limits, intervals within normal limits, no acute ST-T wave changes.   Recent  Labs: 04/19/2022: BUN 12; Creatinine, Ser 0.98; Hemoglobin 16.5; Platelets 257; Potassium 4.3; Sodium 138    Lipid Panel No results found for: "CHOL", "TRIG", "HDL", "CHOLHDL", "VLDL", "LDLCALC", "LDLDIRECT"    Wt Readings from Last 3 Encounters:  05/21/22 196 lb 12.8 oz (89.3 kg)  01/05/21 194 lb 6.4 oz (88.2 kg)  12/06/20 193 lb 4 oz (87.7 kg)      Other studies Reviewed: Additional studies/ records that were reviewed today include: ED records. Review of the above records demonstrates:  Please see elsewhere in the note.     ASSESSMENT AND PLAN:  Chest discomfort: The patient had some chest discomfort that sounds nonanginal.  He does have coronary calcium is noted.  I am going to send him for a POET (Pl[employer] Exercise Treadmill)  Dyslipidemia: I did review primary care records.  His LDL was 73 and HDL 46.  I think this is a reasonable target.  We talked about a plant-based diet.  HTN: Blood pressure is well controlled.  No change in therapy.   Current medicines are reviewed at length with the patient today.  The patient does not have concerns regarding medicines.  The following changes have been made:  no change  Labs/ tests ordered today include:   Orders Placed This Encounter  Procedures   EXERCISE TOLERANCE TEST (ETT)     Disposition:   FU with me as needed.      Signed, Rollene Rotunda, MD  05/21/2022 2:42 PM    [facility]

## 2022-05-21 ENCOUNTER — Encounter: Payer: Self-pay | Admitting: Cardiology

## 2022-05-21 ENCOUNTER — Ambulatory Visit: Payer: BC Managed Care – PPO | Admitting: Cardiology

## 2022-05-21 VITALS — BP 145/92 | HR 95 | Ht 71.0 in | Wt 196.8 lb

## 2022-05-21 DIAGNOSIS — I2510 Atherosclerotic heart disease of native coronary artery without angina pectoris: Secondary | ICD-10-CM

## 2022-05-31 ENCOUNTER — Telehealth (HOSPITAL_COMMUNITY): Payer: Self-pay | Admitting: *Deleted

## 2022-05-31 NOTE — Telephone Encounter (Signed)
Reached pt and gave instructions for ETT.

## 2022-06-04 ENCOUNTER — Ambulatory Visit (HOSPITAL_COMMUNITY)
Admission: RE | Admit: 2022-06-04 | Discharge: 2022-06-04 | Disposition: A | Discharge status: Home / Self Care | Payer: BC Managed Care – PPO | Source: Ambulatory Visit | Attending: Internal Medicine | Admitting: Internal Medicine

## 2022-06-04 DIAGNOSIS — I2510 Atherosclerotic heart disease of native coronary artery without angina pectoris: Secondary | ICD-10-CM | POA: Diagnosis not present

## 2022-06-04 LAB — EXERCISE TOLERANCE TEST
Base ST Depression (mm): 0 mm
Estimated workload: 12.1
Exercise duration (min): 10 min
Exercise duration (sec): 16 s
MPHR: 173 {beats}/min
Peak HR: 173 {beats}/min
Percent HR: 100 %
Rest HR: 100 {beats}/min
ST Depression (mm): 0 mm

## 2022-06-07 ENCOUNTER — Encounter: Payer: Self-pay | Admitting: *Deleted

## 2022-06-11 ENCOUNTER — Encounter: Payer: Self-pay | Admitting: Internal Medicine

## 2023-02-27 DIAGNOSIS — Z125 Encounter for screening for malignant neoplasm of prostate: Secondary | ICD-10-CM | POA: Diagnosis not present

## 2023-02-27 DIAGNOSIS — E785 Hyperlipidemia, unspecified: Secondary | ICD-10-CM | POA: Diagnosis not present

## 2023-02-27 DIAGNOSIS — R7989 Other specified abnormal findings of blood chemistry: Secondary | ICD-10-CM | POA: Diagnosis not present

## 2023-02-27 DIAGNOSIS — I10 Essential (primary) hypertension: Secondary | ICD-10-CM | POA: Diagnosis not present

## 2023-02-27 DIAGNOSIS — K219 Gastro-esophageal reflux disease without esophagitis: Secondary | ICD-10-CM | POA: Diagnosis not present

## 2023-03-03 DIAGNOSIS — K219 Gastro-esophageal reflux disease without esophagitis: Secondary | ICD-10-CM | POA: Diagnosis not present

## 2023-03-03 DIAGNOSIS — Z0000 Encounter for general adult medical examination without abnormal findings: Secondary | ICD-10-CM | POA: Diagnosis not present

## 2023-03-03 DIAGNOSIS — Z1331 Encounter for screening for depression: Secondary | ICD-10-CM | POA: Diagnosis not present

## 2023-03-03 DIAGNOSIS — I10 Essential (primary) hypertension: Secondary | ICD-10-CM | POA: Diagnosis not present

## 2023-03-03 DIAGNOSIS — E785 Hyperlipidemia, unspecified: Secondary | ICD-10-CM | POA: Diagnosis not present

## 2023-03-03 DIAGNOSIS — Z23 Encounter for immunization: Secondary | ICD-10-CM | POA: Diagnosis not present

## 2023-03-03 DIAGNOSIS — F909 Attention-deficit hyperactivity disorder, unspecified type: Secondary | ICD-10-CM | POA: Diagnosis not present

## 2023-04-22 IMAGING — CT CT ANGIO CHEST-ABD-PELV FOR DISSECTION W/ AND WO/W CM
2 of 7 series · 14 of 46 positions shown, 16 images · non-contrast
Comparison: None Available.

CLINICAL DATA: Chest or back pain with dissection suspected.

EXAM:
CT ANGIOGRAPHY CHEST, ABDOMEN AND PELVIS
TECHNIQUE: Non-contrast CT of the chest was initially obtained.

[Series 7: dissection 3.0 i30f 3 · axial · 0.80mm/px · z∈[+850,+1426]mm · 11 of 222 slices shown, 13 images]
[im 15/222  soft-tissue]
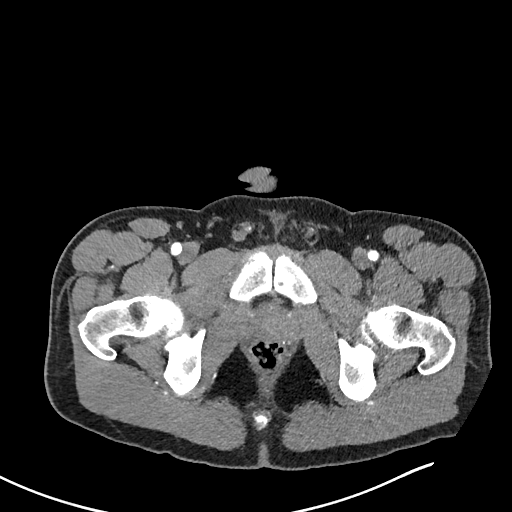
[im 15/222  bone]
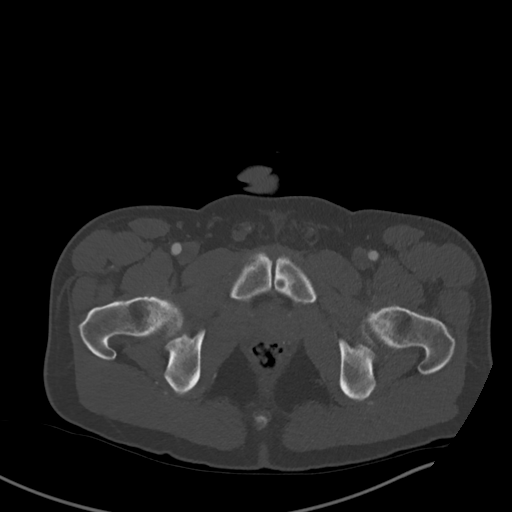
[im 30/222  soft-tissue]
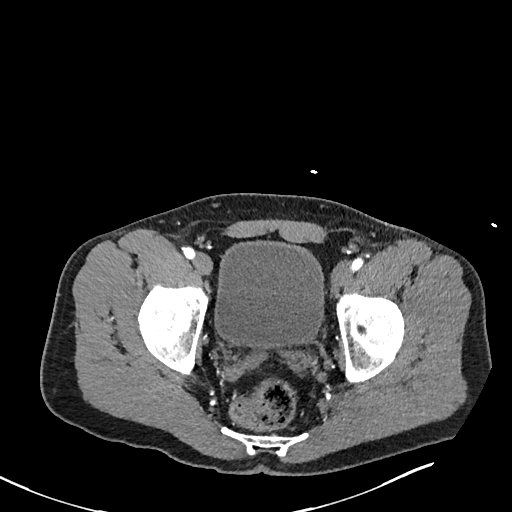
[im 59/222  soft-tissue]
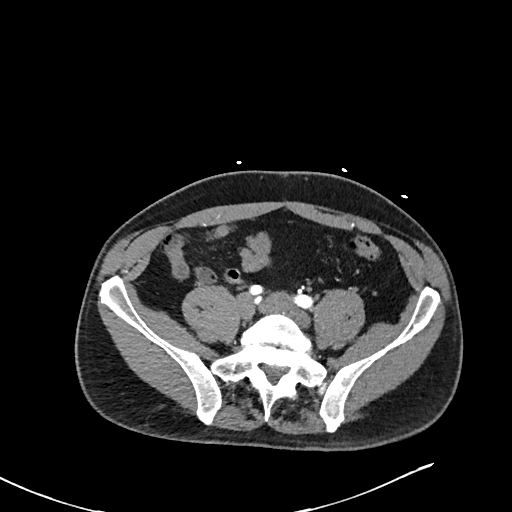
[im 74/222  soft-tissue]
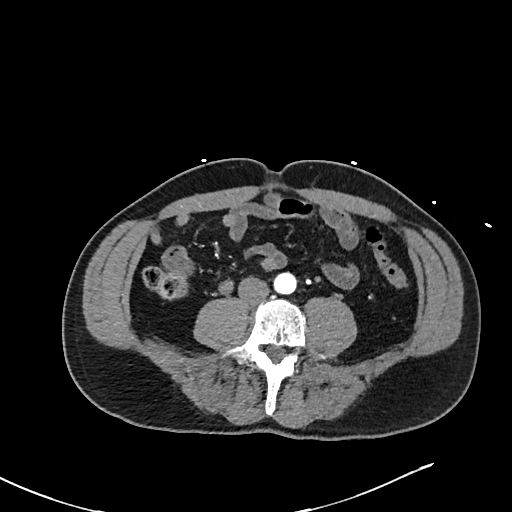
[im 89/222  soft-tissue]
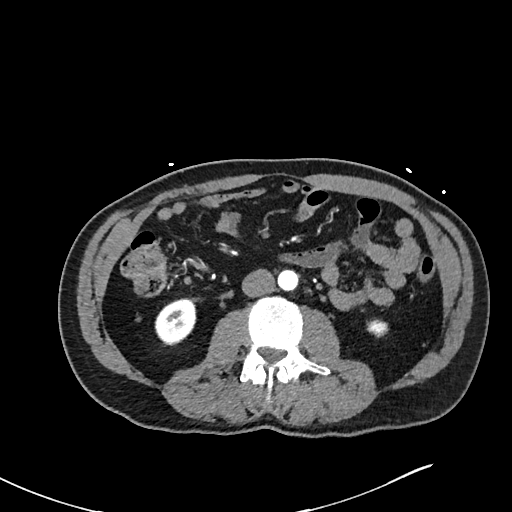
[im 118/222  soft-tissue]
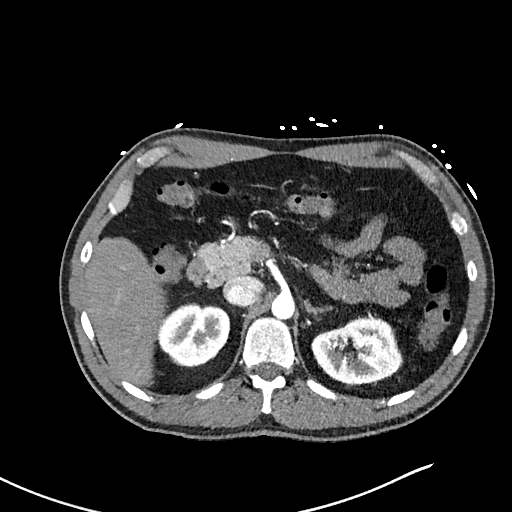
[im 133/222  soft-tissue]
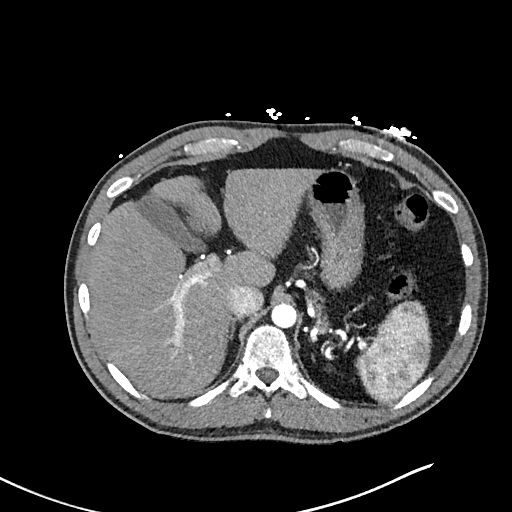
[im 148/222  soft-tissue]
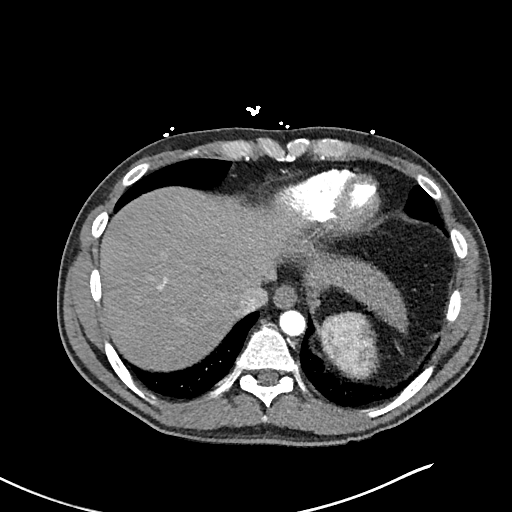
[im 163/222  soft-tissue]
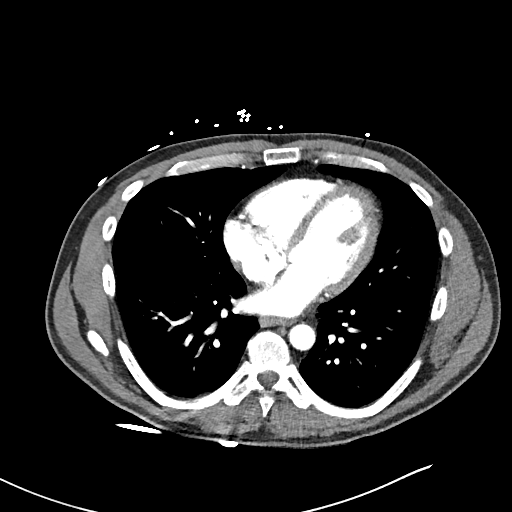
[im 163/222  bone]
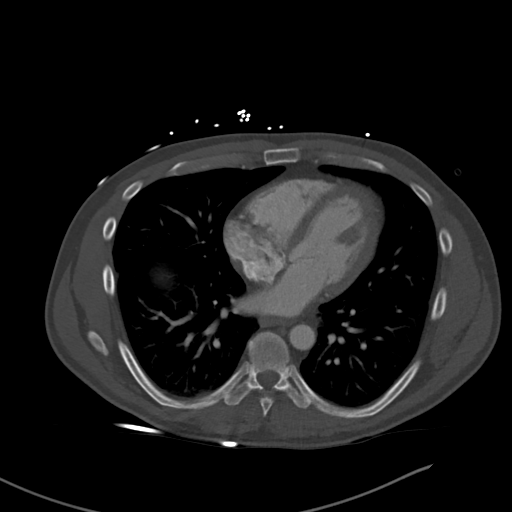
[im 192/222  soft-tissue]
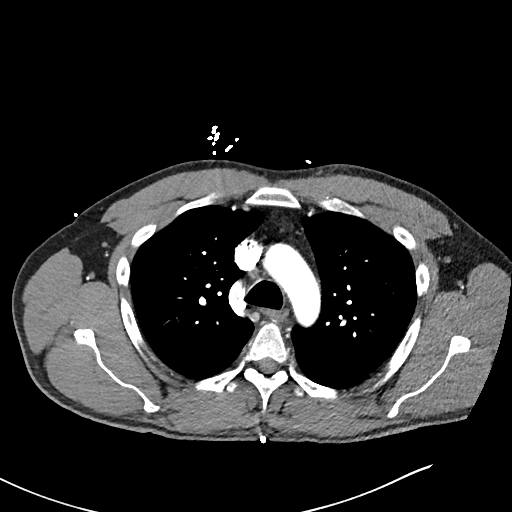
[im 207/222  soft-tissue]
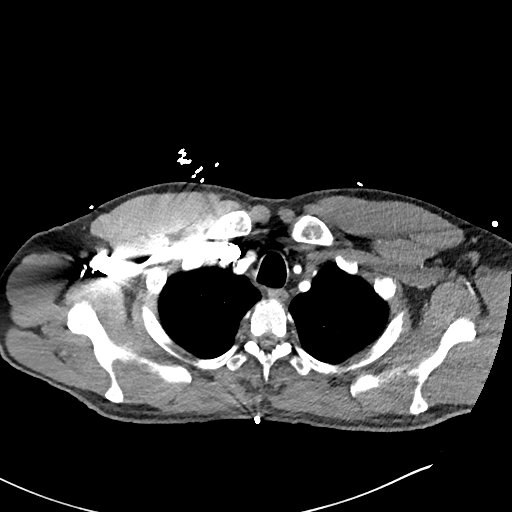

[Series 10: coronals · coronal · 0.72mm/px · 3 of 151 slices shown]
[im 38/151  soft-tissue]
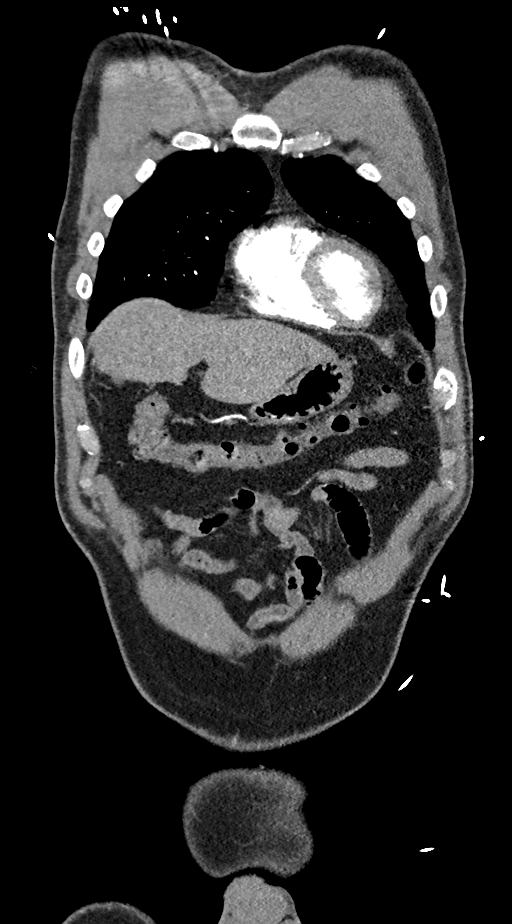
[im 76/151  soft-tissue]
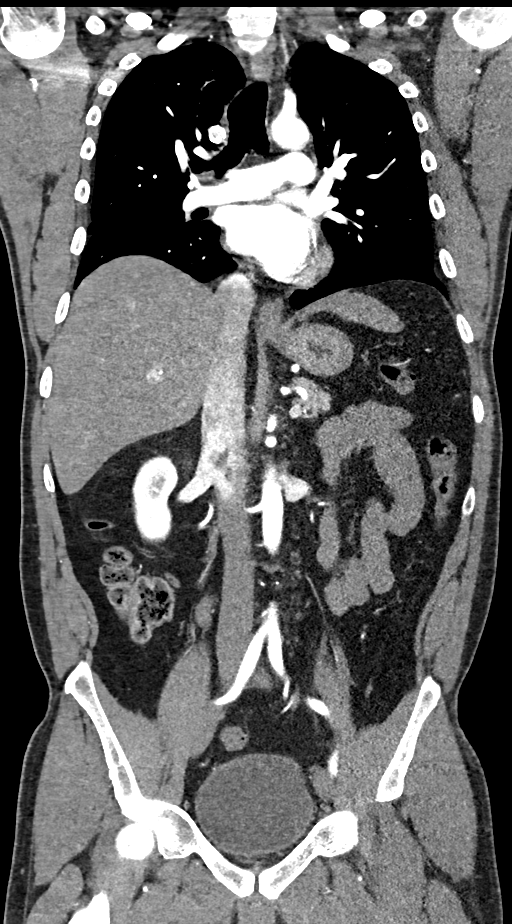
[im 113/151  soft-tissue]
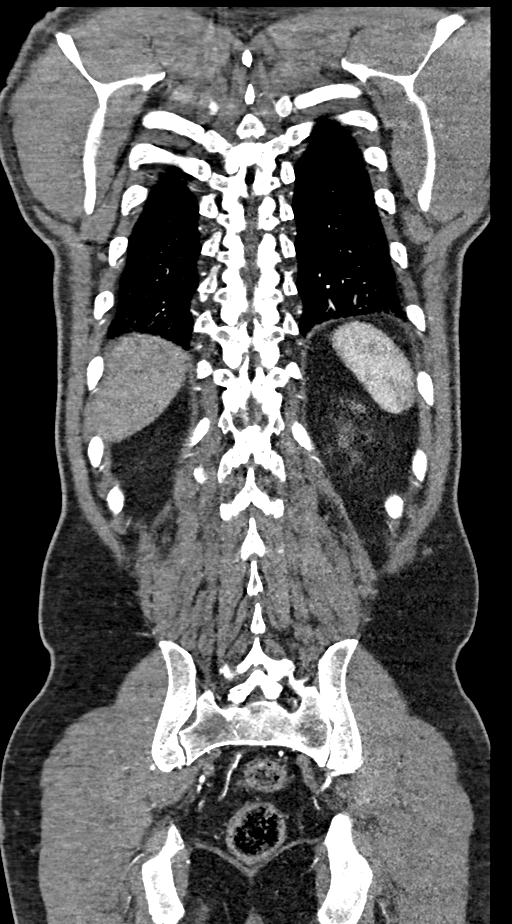

[14 of 46 positions shown; findings below may reference images not displayed]

Multidetector CT imaging through the chest, abdomen and pelvis was
performed using the standard protocol during bolus administration of
intravenous contrast. Multiplanar reconstructed images and MIPs were
obtained and reviewed to evaluate the vascular anatomy.

RADIATION DOSE REDUCTION: This exam was performed according to the
departmental dose-optimization program which includes automated
exposure control, adjustment of the mA and/or kV according to
patient size and/or use of iterative reconstruction technique.

CONTRAST:  100mL OMNIPAQUE IOHEXOL 350 MG/ML SOLN
FINDINGS: CTA CHEST FINDINGS

Cardiovascular: Preferential opacification of the thoracic aorta. No
evidence of thoracic aortic aneurysm or dissection. Normal heart
size. No pericardial effusion. Mild atheromatous calcifications seen
along the LAD

Mediastinum/Nodes: Negative for adenopathy or mass

Lungs/Pleura: There is no edema, consolidation, effusion, or
pneumothorax.

Musculoskeletal: No acute or aggressive finding

Review of the MIP images confirms the above findings.

CTA ABDOMEN AND PELVIS FINDINGS

VASCULAR

Aorta: Widely patent and smoothly contoured. Negative for aneurysm
or dissection

Celiac: Unremarkable

SMA: Unremarkable

Renals: Duplicated right renal arteries. Vessels are smooth and
widely patent.

IMA: Unremarkable

Inflow: Unremarkable

Veins: Unremarkable in the arterial phase

Review of the MIP images confirms the above findings.

NON-VASCULAR

Hepatobiliary: No focal liver abnormality.No evidence of biliary
obstruction or stone.

Pancreas: Unremarkable.

Spleen: Unremarkable.

Adrenals/Urinary Tract: Negative adrenals. No hydronephrosis or
stone. Unremarkable bladder.

Stomach/Bowel:  No obstruction. No appendicitis.

Lymphatic: No mass or adenopathy.

Reproductive:No pathologic findings.

Other: No ascites or pneumoperitoneum.

Musculoskeletal: No acute abnormalities.

Review of the MIP images confirms the above findings.
IMPRESSION: 1. Negative CTA of the aorta.  No acute finding.
2. Coronary atherosclerosis.

## 2023-05-20 DIAGNOSIS — M25562 Pain in left knee: Secondary | ICD-10-CM | POA: Diagnosis not present
